# Patient Record
Sex: Male | Born: 1995 | Race: Black or African American | Hispanic: No | Marital: Married | Smoking: Current some day smoker
Health system: Southern US, Community
[De-identification: ages and names within clinical notes are randomized; demographics above are authoritative.]

## PROBLEM LIST (undated history)

## (undated) HISTORY — PX: ANKLE SURGERY: SHX546

---
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
## 2019-11-24 ENCOUNTER — Encounter (HOSPITAL_COMMUNITY): Payer: Self-pay

## 2019-11-24 ENCOUNTER — Other Ambulatory Visit: Payer: Self-pay

## 2019-11-24 ENCOUNTER — Emergency Department (HOSPITAL_COMMUNITY)
Admission: EM | Admit: 2019-11-24 | Discharge: 2019-11-24 | Disposition: A | Discharge status: Home / Self Care | Attending: Emergency Medicine | Admitting: Emergency Medicine

## 2019-11-24 DIAGNOSIS — T63441A Toxic effect of venom of bees, accidental (unintentional), initial encounter: Secondary | ICD-10-CM | POA: Diagnosis present

## 2019-11-24 DIAGNOSIS — F17200 Nicotine dependence, unspecified, uncomplicated: Secondary | ICD-10-CM | POA: Insufficient documentation

## 2019-11-24 MED ORDER — DIPHENHYDRAMINE HCL 25 MG PO CAPS
25.0000 mg | ORAL_CAPSULE | Freq: Once | ORAL | Status: AC
  Administered 2019-11-24: 25 mg via ORAL
  Filled 2019-11-24: qty 1

## 2019-11-24 MED ORDER — EPINEPHRINE 0.3 MG/0.3ML IJ SOAJ: 0.3000 mg | INTRAMUSCULAR | 0 refills | Status: AC | PRN

## 2019-11-24 MED ORDER — IBUPROFEN 200 MG PO TABS
600.0000 mg | ORAL_TABLET | Freq: Once | ORAL | Status: AC
  Administered 2019-11-24: 600 mg via ORAL
  Filled 2019-11-24: qty 3

## 2019-11-24 NOTE — ED Provider Notes (Signed)
[facility]-EMERGENCY DEPT Provider Note   CSN: [id number] Arrival date & time: 11/24/19  1434     History Chief Complaint  Patient presents with  . Insect Bite    Sean Andersen is a 24 y.o. male.  HPI   Patient presents to the emergency department  with chief complaint of bee stings that happened today while he was mowing the lawn.  Patient explains he was stung by several bees on his arms and legs.  He denies history of anaphylactic shock to bees, denies swelling of the tongue or throat, difficulty breathing, diffuse rash.  He does admit that the bites are a burning sensation and denies itchiness.  He has not taken any medication for this, he denies alleviating or aggravating factors.  He has no significant medical history, does not take any medication on daily basis.  He denies headache, fever, chills, shortness breath, chest pain, abdominal pain, dysuria, pedal edema.  History reviewed. No pertinent past medical history.  There are no problems to display for this patient.   Past Surgical History:  Procedure Laterality Date  . ANKLE SURGERY         History reviewed. No pertinent family history.  Social History   Tobacco Use  . Smoking status: Current Some Day Smoker  . Smokeless tobacco: Never Used  Vaping Use  . Vaping Use: Never used  Substance Use Topics  . Alcohol use: Not Currently  . Drug use: Never    Home Medications Prior to Admission medications   Medication Sig Start Date End Date Taking? Authorizing Provider  EPINEPHrine 0.3 mg/0.3 mL IJ SOAJ injection Inject 0.3 mLs (0.3 mg total) into the muscle as needed for up to 2 doses for anaphylaxis. 11/24/19   Terald Sleeper, MD    Allergies    Patient has no known allergies.  Review of Systems   Review of Systems  Constitutional: Negative for chills and fever.  HENT: Negative for congestion, sore throat, tinnitus and trouble swallowing.   Eyes: Negative for visual  disturbance.  Respiratory: Negative for cough, chest tightness and shortness of breath.   Cardiovascular: Negative for chest pain, palpitations and leg swelling.  Gastrointestinal: Negative for abdominal pain, diarrhea, nausea and vomiting.  Genitourinary: Negative for enuresis, scrotal swelling and testicular pain.  Musculoskeletal: Negative for back pain and joint swelling.       Admits to burning sensation on his arms and legs after being stung by multiple bees.  Skin: Positive for rash.       Patient admits to multiple bee stings that staying.  Neurological: Negative for dizziness, light-headedness and headaches.  Hematological: Does not bruise/bleed easily.    Physical Exam Updated Vital Signs BP (!) 167/84 (BP Location: Left Arm)   Pulse (!) 102   Temp 98 F (36.7 C) (Oral)   Resp 16   Ht 5\' 11"  (1.803 m)   Wt 104.3 kg   SpO2 98%   BMI 32.08 kg/m   Physical Exam Vitals and nursing note reviewed.  Constitutional:      General: He is not in acute distress.    Appearance: He is not ill-appearing.  HENT:     Head: Normocephalic and atraumatic.     Nose: No congestion.     Mouth/Throat:     Mouth: Mucous membranes are moist.     Pharynx: Oropharynx is clear. No oropharyngeal exudate or posterior oropharyngeal erythema.     Comments: Oropharynx visualized, tongue and uvula are both midline  no swelling was noted, patient is controlling his own secretions difficulty. Eyes:     General: No scleral icterus. Cardiovascular:     Rate and Rhythm: Normal rate and regular rhythm.     Pulses: Normal pulses.     Heart sounds: No murmur heard.  No friction rub. No gallop.   Pulmonary:     Effort: No respiratory distress.     Breath sounds: No stridor. No wheezing, rhonchi or rales.  Chest:     Chest wall: No tenderness.  Abdominal:     General: There is no distension.     Tenderness: There is no abdominal tenderness. There is no right CVA tenderness, left CVA tenderness or  guarding.  Musculoskeletal:        General: No swelling.  Skin:    General: Skin is warm and dry.     Capillary Refill: Capillary refill takes less than 2 seconds.     Findings: No rash.     Comments: Patient had multiple papules on his arms and legs from the bee stings.  They were not erythematous, no drainage or discharge noted on exam.  Neurological:     Mental Status: He is alert and oriented to person, place, and time.  Psychiatric:        Mood and Affect: Mood normal.     ED Results / Procedures / Treatments   Labs (all labs ordered are listed, but only abnormal results are displayed) Labs Reviewed - No data to display  EKG None  Radiology No results found.  Procedures Procedures (including critical care time)  Medications Ordered in ED Medications  diphenhydrAMINE (BENADRYL) capsule 25 mg (25 mg Oral Given 11/24/19 1554)  ibuprofen (ADVIL) tablet 600 mg (600 mg Oral Given 11/24/19 1555)    ED Course  I have reviewed the triage vital signs and the nursing notes.  Pertinent labs & imaging results that were available during my care of the patient were reviewed by me and considered in my medical decision making (see chart for details).  Clinical Course as of Nov 24 1738  Fri Nov 24, 2019  1549 24 yo male presenting to ED with bee stings to his arms and his ankle at home.  He was mowing his mother's lawn this afternoon and thinks he stumbled across a hornet's nest.  He was stung in the arms and the ankle.  On my exam he has no signs or symptoms of anaphyalxis.  No hives, no airway swelling, no GI upset, BP is hypertensive but stable.  Oropharynx non-erythematous.  No tonsillar swelling or exudate.  No uvular deviation.  No drooling. No brawny edema. No stridor. Voice is not muffled   [MT]  1552 Okay to treat with benadryl and motrin.  Would not give steroids as he just finished a full course for another reaction earlier this week, and it is not likely to help.   I  offered an epi pen script and discussed when to use this at home if he ever feels he has difficulty breathing, airway swelling, diffuse hives and lightheadedness or GI upset   [MT]    Clinical Course User Index [MT] Trifan, Kermit Balo, MD   MDM Rules/Calculators/A&P                          I have personally reviewed all imaging, labs and have interpreted them.  On exam patient does not appear to be in acute distress, resting calmly  in bed, vital signs reassuring.  Or exam was performed tongue uvula both midline, no signs of impending airway compromise.  No diffuse rash noted, lung sounds are clear bilaterally no stridor or rhonchi heard.  Will provide patient with Benadryl and Motrin.Will defer steroids at this time as there is no swelling or diffuse rash noted on exam.    Unlikely patient suffering from anaphylactic shock as he has no history anaphylaxis from bee stings, vital signs reassuring, no signs of impending compromised airway, lung sounds were clear bilaterally.  Unlikely patient suffering from systemic infection as patient is nontoxic-appearing, vital signs reassuring, physical exam benign, no obvious source of infection.  Due to well appearing patient further imaging and lab work were not indicated at this time.  Patient appears resting calmly bed showing no acute signs chest.  Vital signs have remained stable does not meet criteria to be admitted to the hospital.  Likely patient suffered multiple bee stings and I recommend that he takes Benadryl and over-the-counter pain medication.  Recommend that he follows up with primary care doctor for reevaluation.  Attending agrees with assessment and plan.  Patient is given at home schedule strict return precautions.  Patient verbalized that he understood agrees to plan. Final Clinical Impression(s) / ED Diagnoses Final diagnoses:  Bee sting, accidental or unintentional, initial encounter    Rx / DC Orders ED Discharge Orders          Ordered    EPINEPHrine 0.3 mg/0.3 mL IJ SOAJ injection  As needed     Discontinue  Reprint     11/24/19 1548           Carroll Sage, PA-C 11/24/19 1740    Terald Sleeper, MD 11/25/19 [phone]

## 2019-11-24 NOTE — Discharge Instructions (Signed)
You have been seen here for bee stings.  Your exam and vitals look reassuring.  I recommend that you take Claritin as this will help with swelling as well as itchiness.  Please take this once a day for the next 7 days.  Also recommend that you use hydrocortisone cream please place cream on bites as this can also help with itchiness and swelling.  For pain, you may take over-the-counter pain medications like ibuprofen and orTylenol every 6 hours, follow dosing on the back of bottle.  I want you to follow-up with your primary care doctor in 2 weeks time if symptoms are not fully resolved.  I provided you with the contact information for [facility] they work with individuals with little to no insurance and help you find a primary care provider.  Please contact them at your earliest convenience.  I want you to come back to emergency department if you develop swelling of your tongue or throat, difficulty swallowing own saliva, trouble breathing, developed hives all of your body, chest pain, shortness of breath, uncontrolled nausea, vomiting, abdominal pain as these symptoms require further evaluation management.

## 2019-11-24 NOTE — ED Triage Notes (Signed)
Patient states he ws mowing and got stung by 7 bees. Patient denies any problems swallowing or breathing. Patient is  staating to have swelling in bilateral arms and hands, right ankle.

## 2020-05-28 ENCOUNTER — Other Ambulatory Visit: Payer: Self-pay | Admitting: Orthopaedic Surgery

## 2020-05-28 DIAGNOSIS — M25572 Pain in left ankle and joints of left foot: Secondary | ICD-10-CM

## 2020-06-12 ENCOUNTER — Ambulatory Visit
Admission: RE | Admit: 2020-06-12 | Discharge: 2020-06-12 | Disposition: A | Discharge status: Home / Self Care | Source: Ambulatory Visit | Attending: Orthopaedic Surgery | Admitting: Orthopaedic Surgery

## 2020-06-12 DIAGNOSIS — M25572 Pain in left ankle and joints of left foot: Secondary | ICD-10-CM

## 2020-10-15 DIAGNOSIS — M19072 Primary osteoarthritis, left ankle and foot: Secondary | ICD-10-CM | POA: Diagnosis not present

## 2020-10-15 DIAGNOSIS — M93272 Osteochondritis dissecans, left ankle and joints of left foot: Secondary | ICD-10-CM | POA: Diagnosis not present

## 2020-10-15 DIAGNOSIS — M25475 Effusion, left foot: Secondary | ICD-10-CM | POA: Diagnosis not present

## 2021-01-27 DIAGNOSIS — L290 Pruritus ani: Secondary | ICD-10-CM | POA: Diagnosis not present

## 2021-01-27 DIAGNOSIS — R0789 Other chest pain: Secondary | ICD-10-CM | POA: Diagnosis not present

## 2021-01-30 DIAGNOSIS — R059 Cough, unspecified: Secondary | ICD-10-CM | POA: Diagnosis not present

## 2021-02-18 DIAGNOSIS — F329 Major depressive disorder, single episode, unspecified: Secondary | ICD-10-CM | POA: Diagnosis not present

## 2021-02-18 DIAGNOSIS — Z0001 Encounter for general adult medical examination with abnormal findings: Secondary | ICD-10-CM | POA: Diagnosis not present

## 2021-02-18 DIAGNOSIS — R0789 Other chest pain: Secondary | ICD-10-CM | POA: Diagnosis not present

## 2021-02-19 DIAGNOSIS — Z131 Encounter for screening for diabetes mellitus: Secondary | ICD-10-CM | POA: Diagnosis not present

## 2021-02-19 DIAGNOSIS — Z13220 Encounter for screening for lipoid disorders: Secondary | ICD-10-CM | POA: Diagnosis not present

## 2021-03-03 ENCOUNTER — Ambulatory Visit
Admission: RE | Admit: 2021-03-03 | Discharge: 2021-03-03 | Disposition: A | Discharge status: Home / Self Care | Payer: BC Managed Care – PPO | Source: Ambulatory Visit | Attending: Sports Medicine | Admitting: Sports Medicine

## 2021-03-03 ENCOUNTER — Other Ambulatory Visit: Payer: Self-pay | Admitting: Sports Medicine

## 2021-03-03 DIAGNOSIS — M25572 Pain in left ankle and joints of left foot: Secondary | ICD-10-CM

## 2021-03-07 ENCOUNTER — Other Ambulatory Visit: Payer: Self-pay | Admitting: Sports Medicine

## 2021-03-07 DIAGNOSIS — F329 Major depressive disorder, single episode, unspecified: Secondary | ICD-10-CM | POA: Diagnosis not present

## 2021-03-07 DIAGNOSIS — M25572 Pain in left ankle and joints of left foot: Secondary | ICD-10-CM

## 2021-03-30 ENCOUNTER — Other Ambulatory Visit: Payer: Self-pay

## 2021-03-30 ENCOUNTER — Ambulatory Visit
Admission: RE | Admit: 2021-03-30 | Discharge: 2021-03-30 | Disposition: A | Discharge status: Home / Self Care | Payer: BC Managed Care – PPO | Source: Ambulatory Visit | Attending: Sports Medicine | Admitting: Sports Medicine

## 2021-03-30 DIAGNOSIS — R600 Localized edema: Secondary | ICD-10-CM | POA: Diagnosis not present

## 2021-03-30 DIAGNOSIS — M25572 Pain in left ankle and joints of left foot: Secondary | ICD-10-CM

## 2021-03-30 DIAGNOSIS — M19072 Primary osteoarthritis, left ankle and foot: Secondary | ICD-10-CM | POA: Diagnosis not present

## 2021-03-30 DIAGNOSIS — Q666 Other congenital valgus deformities of feet: Secondary | ICD-10-CM | POA: Diagnosis not present

## 2021-03-30 DIAGNOSIS — M25472 Effusion, left ankle: Secondary | ICD-10-CM | POA: Diagnosis not present

## 2021-05-19 DIAGNOSIS — F329 Major depressive disorder, single episode, unspecified: Secondary | ICD-10-CM | POA: Diagnosis not present

## 2021-07-01 DIAGNOSIS — R590 Localized enlarged lymph nodes: Secondary | ICD-10-CM | POA: Diagnosis not present

## 2021-07-01 DIAGNOSIS — Z01812 Encounter for preprocedural laboratory examination: Secondary | ICD-10-CM | POA: Diagnosis not present

## 2021-07-01 DIAGNOSIS — R109 Unspecified abdominal pain: Secondary | ICD-10-CM | POA: Diagnosis not present

## 2021-07-01 DIAGNOSIS — K380 Hyperplasia of appendix: Secondary | ICD-10-CM | POA: Diagnosis not present

## 2021-07-02 DIAGNOSIS — K37 Unspecified appendicitis: Secondary | ICD-10-CM | POA: Diagnosis not present

## 2021-07-30 ENCOUNTER — Emergency Department (HOSPITAL_COMMUNITY): Payer: No Typology Code available for payment source

## 2021-07-30 ENCOUNTER — Encounter (HOSPITAL_COMMUNITY): Payer: Self-pay

## 2021-07-30 ENCOUNTER — Emergency Department (HOSPITAL_COMMUNITY)
Admission: EM | Admit: 2021-07-30 | Discharge: 2021-07-31 | Disposition: A | Discharge status: Home / Self Care | Payer: No Typology Code available for payment source | Attending: Emergency Medicine | Admitting: Emergency Medicine

## 2021-07-30 ENCOUNTER — Other Ambulatory Visit: Payer: Self-pay

## 2021-07-30 DIAGNOSIS — M79605 Pain in left leg: Secondary | ICD-10-CM | POA: Diagnosis not present

## 2021-07-30 DIAGNOSIS — G8929 Other chronic pain: Secondary | ICD-10-CM | POA: Insufficient documentation

## 2021-07-30 NOTE — ED Provider Triage Note (Signed)
Emergency Medicine Provider Triage Evaluation Note ? ?Rudi Coco , a 26 y.o. male  was evaluated in triage.  Pt complains of leg pain. ? ?Review of Systems  ?Positive: Pain L thigh ?Negative: Fever, hip pain, knee pain ? ?Physical Exam  ?BP 133/77 (BP Location: Left Arm)   Pulse 69   Temp 98.2 ?F (36.8 ?C) (Oral)   Resp 16   Ht 5\' 10"  (1.778 m)   Wt 92.1 kg   SpO2 99%   BMI 29.13 kg/m?  ?Gen:   Awake, no distress   ?Resp:  Normal effort  ?MSK:   Moves extremities without difficulty  ?Other:   ? ?Medical Decision Making  ?Medically screening exam initiated at 9:24 PM.  Appropriate orders placed.  Caid Henline was informed that the remainder of the evaluation will be completed by another provider, this initial triage assessment does not replace that evaluation, and the importance of remaining in the ED until their evaluation is complete. ? ?Pain to L thigh x 1 month, worse today.  Injured L ankle in the past. No hx of DVT or risk factors for DVT ?  ?Domenic Moras, PA-C ?07/30/21 2128 ? ?

## 2021-07-30 NOTE — ED Triage Notes (Signed)
Pt complaining of left leg pain that started a month ago. Hurting more in the thigh. Hx of ankle injury on that same side.  ?

## 2021-07-31 MED ORDER — HYDROCODONE-ACETAMINOPHEN 5-325 MG PO TABS: 1.0000 | ORAL_TABLET | ORAL | 0 refills | Status: AC | PRN

## 2021-07-31 NOTE — Discharge Instructions (Signed)
You were evaluated in the Emergency Department and after careful evaluation, we did not find any emergent condition requiring admission or further testing in the hospital. ? ?Your exam/testing today was overall reassuring.  Symptoms may be due to quadriceps tendinitis.  Recommend continued use of anti-inflammatories.  Recommend follow-up with the orthopedic specialist.  X-ray did not show any abnormalities. ? ?Please return to the Emergency Department if you experience any worsening of your condition.  Thank you for allowing [place] to be a part of your care. ? ?

## 2021-07-31 NOTE — ED Provider Notes (Signed)
?[facility]-EMERGENCY DEPT ?[facility] Emergency Department ?Provider Note ?MRN:  [id number]  ?Arrival date & time: 07/31/21    ? ?Chief Complaint   ?Leg Pain ?  ?History of Present Illness   ?Cesare Sumlin is a 26 y.o. year-old male with no pertinent past medical history presenting to the ED with chief complaint of leg pain. ? ?Pain to the left thigh for the past month, not getting better.  Worse with ambulating or using the leg.  Patient also having ankle issues, had surgery on his ankle a few years ago and needs another surgery.  Denies fever, no rash, no swelling, no chest pain or shortness of breath, no other complaints. ? ?Review of Systems  ?A thorough review of systems was obtained and all systems are negative except as noted in the HPI and PMH.  ? ?Patient's Health History   ?History reviewed. No pertinent past medical history.  ?Past Surgical History:  ?Procedure Laterality Date  ? ANKLE SURGERY    ?  ?History reviewed. No pertinent family history.  ?Social History  ? ?Socioeconomic History  ? Marital status: Married  ?  Spouse name: Not on file  ? Number of children: Not on file  ? Years of education: Not on file  ? Highest education level: Not on file  ?Occupational History  ? Not on file  ?Tobacco Use  ? Smoking status: Some Days  ? Smokeless tobacco: Never  ?Vaping Use  ? Vaping Use: Never used  ?Substance and Sexual Activity  ? Alcohol use: Not Currently  ? Drug use: Never  ? Sexual activity: Not on file  ?Other Topics Concern  ? Not on file  ?Social History Narrative  ? Not on file  ? ?Social Determinants of Health  ? ?Financial Resource Strain: Not on file  ?Food Insecurity: Not on file  ?Transportation Needs: Not on file  ?Physical Activity: Not on file  ?Stress: Not on file  ?Social Connections: Not on file  ?Intimate Partner Violence: Not on file  ?  ? ?Physical Exam  ? ?Vitals:  ? 07/30/21 2027 07/30/21 2116  ?BP: 133/77   ?Pulse: 69   ?Resp: 16   ?Temp: 98.2 ?F (36.8 ?C)   ?SpO2: 99% 99%   ?  ?CONSTITUTIONAL: Well-appearing, NAD ?NEURO/PSYCH:  Alert and oriented x 3, no focal deficits ?EYES:  eyes equal and reactive ?ENT/NECK:  no LAD, no JVD ?CARDIO: Regular rate, well-perfused, normal S1 and S2 ?PULM:  CTAB no wheezing or rhonchi ?GI/GU:  non-distended, non-tender ?MSK/SPINE:  No gross deformities, no edema ?SKIN:  no rash, atraumatic ? ? ?*Additional and/or pertinent findings included in MDM below ? ?Diagnostic and Interventional Summary  ? ? EKG Interpretation ? ?Date/Time:    ?Ventricular Rate:    ?PR Interval:    ?QRS Duration:   ?QT Interval:    ?QTC Calculation:   ?R Axis:     ?Text Interpretation:   ?  ? ?  ? ?Labs Reviewed - No data to display  ?DG Femur Min 2 Views Left  ?Final Result  ?  ?  ?Medications - No data to display  ? ?Procedures  /  Critical Care ?Procedures ? ?ED Course and Medical Decision Making  ?Initial Impression and Ddx ?This is a an otherwise pretty healthy 26 year old male with normal vital signs presenting with chronic left thigh pain.  The leg looks completely normal, he has normal range of motion, range of motion does not elicit any pain, no erythema, no increased warmth, no  swelling, nothing to suggest infection or DVT.  Neurovascularly completely intact.  Nothing to suggest an emergent process at this time.  Suspect quadriceps tendinitis possibly triggered by having chronic issues with his ankle on the same leg, appropriate for discharge with orthopedic follow-up. ? ?Past medical/surgical history that increases complexity of ED encounter: History of ankle surgery ? ?Interpretation of Diagnostics ?I personally reviewed the femur x-ray and my interpretation is as follows: Normal, no obvious fracture or mass ?   ? ? ?Patient Reassessment and Ultimate Disposition/Management ?Discharge home ? ?Patient management required discussion with the following services or consulting groups:  None ? ?Complexity of Problems Addressed ?Acute complicated illness or  Injury ? ?Additional Data Reviewed and Analyzed ?Further history obtained from: ?Further history from spouse/family member ? ?Additional Factors Impacting ED Encounter Risk ?Prescriptions ? ?Elmer Sow. Pilar Plate, MD ?[facility] Emergency Medicine ?[facility] [email or web address] ? ?Final Clinical Impressions(s) / ED Diagnoses  ? ?  ICD-10-CM   ?1. Pain of left lower extremity  M79.605   ?  ?  ?ED Discharge Orders   ? ?      Ordered  ?  HYDROcodone-acetaminophen (NORCO/VICODIN) 5-325 MG tablet  Every 4 hours PRN       ? 07/31/21 0037  ? ?  ?  ? ?  ?  ? ?Discharge Instructions Discussed with and Provided to Patient:  ? ? ?Discharge Instructions   ? ?  ?You were evaluated in the Emergency Department and after careful evaluation, we did not find any emergent condition requiring admission or further testing in the hospital. ? ?Your exam/testing today was overall reassuring.  Symptoms may be due to quadriceps tendinitis.  Recommend continued use of anti-inflammatories.  Recommend follow-up with the orthopedic specialist.  X-ray did not show any abnormalities. ? ?Please return to the Emergency Department if you experience any worsening of your condition.  Thank you for allowing [place] to be a part of your care. ? ? ? ? ?  ?Sabas Sous, MD ?07/31/21 [phone] ? ?

## 2021-09-09 ENCOUNTER — Other Ambulatory Visit: Payer: Self-pay | Admitting: Orthopaedic Surgery

## 2021-09-09 DIAGNOSIS — M216X2 Other acquired deformities of left foot: Secondary | ICD-10-CM

## 2021-09-09 DIAGNOSIS — M216X9 Other acquired deformities of unspecified foot: Secondary | ICD-10-CM

## 2021-09-11 ENCOUNTER — Other Ambulatory Visit: Payer: Self-pay | Admitting: Orthopaedic Surgery

## 2021-09-11 DIAGNOSIS — M216X9 Other acquired deformities of unspecified foot: Secondary | ICD-10-CM

## 2021-09-16 ENCOUNTER — Other Ambulatory Visit: Payer: No Typology Code available for payment source

## 2021-09-17 ENCOUNTER — Ambulatory Visit
Admission: RE | Admit: 2021-09-17 | Discharge: 2021-09-17 | Disposition: A | Discharge status: Home / Self Care | Payer: BC Managed Care – PPO | Source: Ambulatory Visit | Attending: Orthopaedic Surgery | Admitting: Orthopaedic Surgery

## 2021-09-17 DIAGNOSIS — M216X9 Other acquired deformities of unspecified foot: Secondary | ICD-10-CM

## 2021-09-23 ENCOUNTER — Other Ambulatory Visit: Payer: No Typology Code available for payment source

## 2021-09-29 ENCOUNTER — Ambulatory Visit
Admission: RE | Admit: 2021-09-29 | Discharge: 2021-09-29 | Disposition: A | Discharge status: Home / Self Care | Payer: No Typology Code available for payment source | Source: Ambulatory Visit | Attending: Orthopaedic Surgery | Admitting: Orthopaedic Surgery

## 2021-09-29 ENCOUNTER — Other Ambulatory Visit: Payer: No Typology Code available for payment source

## 2021-09-29 DIAGNOSIS — M216X9 Other acquired deformities of unspecified foot: Secondary | ICD-10-CM

## 2021-10-16 ENCOUNTER — Other Ambulatory Visit: Payer: No Typology Code available for payment source

## 2021-12-08 ENCOUNTER — Emergency Department (HOSPITAL_COMMUNITY): Payer: No Typology Code available for payment source

## 2021-12-08 ENCOUNTER — Other Ambulatory Visit: Payer: Self-pay

## 2021-12-08 ENCOUNTER — Encounter (HOSPITAL_COMMUNITY): Payer: Self-pay

## 2021-12-08 ENCOUNTER — Emergency Department (HOSPITAL_COMMUNITY)
Admission: EM | Admit: 2021-12-08 | Discharge: 2021-12-08 | Disposition: A | Discharge status: Home / Self Care | Payer: No Typology Code available for payment source | Attending: Emergency Medicine | Admitting: Emergency Medicine

## 2021-12-08 DIAGNOSIS — R55 Syncope and collapse: Secondary | ICD-10-CM | POA: Insufficient documentation

## 2021-12-08 DIAGNOSIS — R531 Weakness: Secondary | ICD-10-CM | POA: Insufficient documentation

## 2021-12-08 DIAGNOSIS — R42 Dizziness and giddiness: Secondary | ICD-10-CM | POA: Diagnosis not present

## 2021-12-08 DIAGNOSIS — R109 Unspecified abdominal pain: Secondary | ICD-10-CM | POA: Insufficient documentation

## 2021-12-08 DIAGNOSIS — R5383 Other fatigue: Secondary | ICD-10-CM | POA: Insufficient documentation

## 2021-12-08 LAB — URINALYSIS, ROUTINE W REFLEX MICROSCOPIC
Bilirubin Urine: NEGATIVE
Glucose, UA: NEGATIVE mg/dL
Hgb urine dipstick: NEGATIVE
Ketones, ur: NEGATIVE mg/dL
Leukocytes,Ua: NEGATIVE
Nitrite: NEGATIVE
Protein, ur: NEGATIVE mg/dL
Specific Gravity, Urine: 1.021 (ref 1.005–1.030)
pH: 6 (ref 5.0–8.0)

## 2021-12-08 LAB — HEPATIC FUNCTION PANEL
ALT: 25 U/L (ref 0–44)
AST: 22 U/L (ref 15–41)
Albumin: 3.9 g/dL (ref 3.5–5.0)
Alkaline Phosphatase: 38 U/L (ref 38–126)
Bilirubin, Direct: 0.2 mg/dL (ref 0.0–0.2)
Indirect Bilirubin: 0.4 mg/dL (ref 0.3–0.9)
Total Bilirubin: 0.6 mg/dL (ref 0.3–1.2)
Total Protein: 6.7 g/dL (ref 6.5–8.1)

## 2021-12-08 LAB — BASIC METABOLIC PANEL
Anion gap: 5 (ref 5–15)
BUN: 16 mg/dL (ref 6–20)
CO2: 24 mmol/L (ref 22–32)
Calcium: 9.3 mg/dL (ref 8.9–10.3)
Chloride: 111 mmol/L (ref 98–111)
Creatinine, Ser: 1.05 mg/dL (ref 0.61–1.24)
GFR, Estimated: >60 mL/min (ref >60)
Glucose, Bld: 117 mg/dL — ABNORMAL HIGH (ref 70–99)
Potassium: 4.1 mmol/L (ref 3.5–5.1)
Sodium: 140 mmol/L (ref 135–145)

## 2021-12-08 LAB — CBC
HCT: 40.1 % (ref 39.0–52.0)
Hemoglobin: 13.4 g/dL (ref 13.0–17.0)
MCH: 32.8 pg (ref 26.0–34.0)
MCHC: 33.4 g/dL (ref 30.0–36.0)
MCV: 98 fL (ref 80.0–100.0)
Platelets: 239 10*3/uL (ref 150–400)
RBC: 4.09 MIL/uL — ABNORMAL LOW (ref 4.22–5.81)
RDW: 13.3 % (ref 11.5–15.5)
WBC: 8.6 10*3/uL (ref 4.0–10.5)
nRBC: 0 % (ref 0.0–0.2)

## 2021-12-08 LAB — CBG MONITORING, ED
Glucose-Capillary: 118 mg/dL — ABNORMAL HIGH (ref 70–99)
Glucose-Capillary: 126 mg/dL — ABNORMAL HIGH (ref 70–99)

## 2021-12-08 LAB — TROPONIN I (HIGH SENSITIVITY): Troponin I (High Sensitivity): 3 ng/L (ref <18)

## 2021-12-08 LAB — D-DIMER, QUANTITATIVE: D-Dimer, Quant: 0.29 ug/mL-FEU (ref 0.00–0.50)

## 2021-12-08 LAB — LIPASE, BLOOD: Lipase: 29 U/L (ref 11–51)

## 2021-12-08 MED ORDER — SODIUM CHLORIDE 0.9 % IV BOLUS
1000.0000 mL | Freq: Once | INTRAVENOUS | Status: AC
  Administered 2021-12-08: 1000 mL via INTRAVENOUS

## 2021-12-08 MED ORDER — CYCLOBENZAPRINE HCL 10 MG PO TABS: 10.0000 mg | ORAL_TABLET | Freq: Two times a day (BID) | ORAL | 0 refills | Status: AC | PRN

## 2021-12-08 NOTE — ED Triage Notes (Signed)
Pt reports back pain that began today and an episode of abdominal pain earlier today. Pt reports feeling lightheaded and dizzy when walking.

## 2021-12-08 NOTE — ED Provider Notes (Signed)
Sean Andersen   CSN: [id number] Arrival date & time: 12/08/21  1850     History  Chief Complaint  Patient presents with   Dizziness   Back Pain    Sean Andersen is a 26 y.o. male.  Patient here with right flank pain, dizziness, weakness, fatigue feeling.  He states he started to have some right flank pain the last 2 days.  Took a muscle relaxant this morning and has been kind of weak and out of it since.  He was getting a haircut when he got hot and flushed and dizzy and almost passed out but did not.  He denies any headache or vision loss or stroke symptoms.  He still having some right-sided flank pain.  Denies history of kidney stone.  Denies any medical problems.  Denies any alcohol or drugs.  Nothing makes it worse or better.  The history is provided by the patient.       Home Medications Prior to Admission medications   Medication Sig Start Date End Date Taking? Authorizing Provider  cyclobenzaprine (FLEXERIL) 10 MG tablet Take 1 tablet (10 mg total) by mouth 2 (two) times daily as needed for muscle spasms. 12/08/21  Yes Jaziyah Gradel, DO  EPINEPHrine 0.3 mg/0.3 mL IJ SOAJ injection Inject 0.3 mLs (0.3 mg total) into the muscle as needed for up to 2 doses for anaphylaxis. 11/24/19   Wyvonnia Dusky, MD  HYDROcodone-acetaminophen (NORCO/VICODIN) 5-325 MG tablet Take 1 tablet by mouth every 4 (four) hours as needed. 07/31/21   Maudie Flakes, MD      Allergies    Penicillin g    Review of Systems   Review of Systems  Physical Exam Updated Vital Signs BP 111/62   Pulse 70   Temp 99.2 F (37.3 C) (Oral)   Resp (!) 23   Ht 5\' 10"  (1.778 m)   Wt 92.1 kg   SpO2 95%   BMI 29.13 kg/m  Physical Exam Vitals and nursing Andersen reviewed.  Constitutional:      General: He is not in acute distress.    Appearance: He is well-developed. He is not ill-appearing.  HENT:     Head: Normocephalic and atraumatic.     Nose: Nose  normal.     Mouth/Throat:     Mouth: Mucous membranes are moist.  Eyes:     Extraocular Movements: Extraocular movements intact.     Conjunctiva/sclera: Conjunctivae normal.     Pupils: Pupils are equal, round, and reactive to light.  Cardiovascular:     Rate and Rhythm: Normal rate and regular rhythm.     Pulses: Normal pulses.     Heart sounds: Normal heart sounds. No murmur heard. Pulmonary:     Effort: Pulmonary effort is normal. No respiratory distress.     Breath sounds: Normal breath sounds.  Abdominal:     Palpations: Abdomen is soft.     Tenderness: There is no abdominal tenderness. There is right CVA tenderness.  Musculoskeletal:        General: No swelling.     Cervical back: Normal range of motion and neck supple.  Skin:    General: Skin is warm and dry.     Capillary Refill: Capillary refill takes less than 2 seconds.  Neurological:     General: No focal deficit present.     Mental Status: He is alert and oriented to person, place, and time.     Cranial Nerves: No cranial  nerve deficit.     Sensory: No sensory deficit.     Motor: No weakness.     Coordination: Coordination normal.     Comments: 5+ out of 5 strength throughout, normal sensation, no drift, normal speech  Psychiatric:        Mood and Affect: Mood normal.     ED Results / Procedures / Treatments   Labs (all labs ordered are listed, but only abnormal results are displayed) Labs Reviewed  BASIC METABOLIC PANEL - Abnormal; Notable for the following components:      Result Value   Glucose, Bld 117 (*)    All other components within normal limits  CBC - Abnormal; Notable for the following components:   RBC 4.09 (*)    All other components within normal limits  CBG MONITORING, ED - Abnormal; Notable for the following components:   Glucose-Capillary 118 (*)    All other components within normal limits  CBG MONITORING, ED - Abnormal; Notable for the following components:   Glucose-Capillary 126 (*)     All other components within normal limits  URINALYSIS, ROUTINE W REFLEX MICROSCOPIC  HEPATIC FUNCTION PANEL  LIPASE, BLOOD  D-DIMER, QUANTITATIVE  TROPONIN I (HIGH SENSITIVITY)    EKG EKG Interpretation  Date/Time:  Monday December 08 2021 19:10:51 EDT Ventricular Rate:  82 PR Interval:  152 QRS Duration: 82 QT Interval:  337 QTC Calculation: 394 R Axis:   92 Text Interpretation: Sinus rhythm early benign repolarization Confirmed by Virgina Norfolk ([id number]) on 12/08/2021 7:31:29 PM  Radiology CT Renal Stone Study  Result Date: 12/08/2021 CLINICAL DATA:  Flank pain EXAM: CT ABDOMEN AND PELVIS WITHOUT CONTRAST TECHNIQUE: Multidetector CT imaging of the abdomen and pelvis was performed following the standard protocol without IV contrast. RADIATION DOSE REDUCTION: This exam was performed according to the departmental dose-optimization program which includes automated exposure control, adjustment of the mA and/or kV according to patient size and/or use of iterative reconstruction technique. COMPARISON:  None Available. FINDINGS: Lower chest: No acute abnormality. Hepatobiliary: No focal hepatic abnormality. Gallbladder unremarkable. Pancreas: No focal abnormality or ductal dilatation. Spleen: No focal abnormality.  Normal size. Adrenals/Urinary Tract: No adrenal abnormality. No focal renal abnormality. No stones or hydronephrosis. Urinary bladder is unremarkable. Stomach/Bowel: Stomach, large and small bowel grossly unremarkable. Vascular/Lymphatic: No evidence of aneurysm or adenopathy. Reproductive: No visible focal abnormality. Other: No free fluid or free air. Musculoskeletal: No acute bony abnormality. IMPRESSION: No renal or ureteral stones.  No hydronephrosis. No acute findings. Electronically Signed   By: Charlett Nose M.D.   On: 12/08/2021 21:34   DG Chest 2 View  Result Date: 12/08/2021 CLINICAL DATA:  Chest pain. EXAM: CHEST - 2 VIEW COMPARISON:  None Available. FINDINGS: The heart size  and mediastinal contours are within normal limits. Both lungs are clear. The visualized skeletal structures are unremarkable. IMPRESSION: No active cardiopulmonary disease. Electronically Signed   By: Elgie Collard M.D.   On: 12/08/2021 19:41    Procedures Procedures    Medications Ordered in ED Medications  sodium chloride 0.9 % bolus 1,000 mL (1,000 mLs Intravenous Bolus 12/08/21 2002)    ED Course/ Medical Decision Making/ A&P                           Medical Decision Making Amount and/or Complexity of Data Reviewed Labs: ordered. Radiology: ordered.   Kyan Yurkovich is here after near syncopal episode, right flank pain.  Normal vitals.  No fever.  Differential diagnosis is kidney stone versus cholecystitis versus less likely ACS or PE or pneumonia or dehydration.  Could be medication side effect as he took Flexeril prior to having lightheadedness event.  Will check CBC, CMP, D-dimer, troponin, EKG, chest x-ray.  We will get CT scan renal study.  Will give IV fluid bolus.  EKG shows benign early repole, no obvious ischemic changes.  Per my review and interpretation of labs is no significant anemia, electrolyte abnormality, kidney injury.  D-dimer is normal and doubt PE.  Troponin is normal.  Doubt ACS.  CT scan of the abdomen pelvis shows no kidney stones or other acute process.  Overall he is feeling better after IV fluids.  Suspect muscle spasm causing his discomfort.  Maybe a little bit more sleepy today as mild side effects from his Flexeril.  Overall given reassurance and discharged in ED in good condition.  Educated about how to carefully use Flexeril.  This chart was dictated using voice recognition software.  Despite best efforts to proofread,  errors can occur which can change the documentation meaning.         Final Clinical Impression(s) / ED Diagnoses Final diagnoses:  Flank pain    Rx / DC Orders ED Discharge Orders          Ordered    cyclobenzaprine  (FLEXERIL) 10 MG tablet  2 times daily PRN        12/08/21 2138              Virgina Norfolk, DO 12/08/21 2138

## 2022-08-03 IMAGING — MR MR ANKLE*L* W/O CM
5 series · 39 of 40 positions shown · non-contrast
Comparison: Multiple exams, including 05/31/2020

CLINICAL DATA: Chronic left ankle pain. Reported remote fracture in
5441.

EXAM:
MRI OF THE LEFT ANKLE WITHOUT CONTRAST
TECHNIQUE: Multiplanar, multisequence MR imaging of the ankle was performed. No
intravenous contrast was administered.

[Series 4: T2 fat-sat · axial · 3.0mm · 0.53mm/px · z∈[-108,+13]mm · 9 of 32 slices shown (1 of 2)]
[im 1/32]
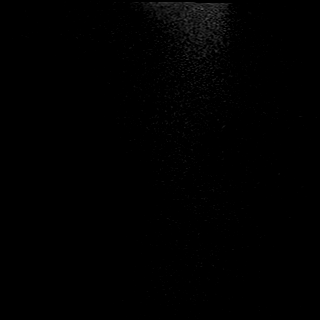
[im 4/32]
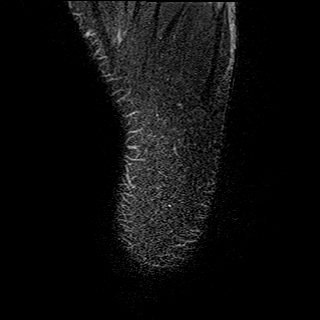
[im 8/32]
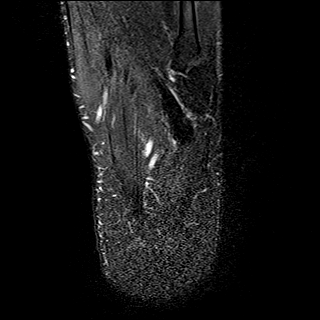
[im 12/32]
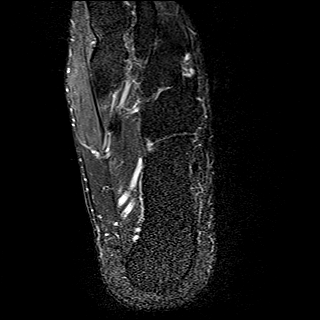
[im 16/32]
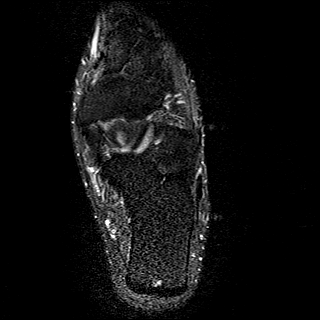
[im 20/32]
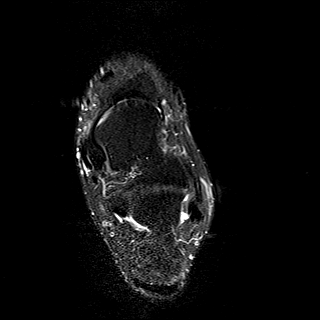
[im 24/32]
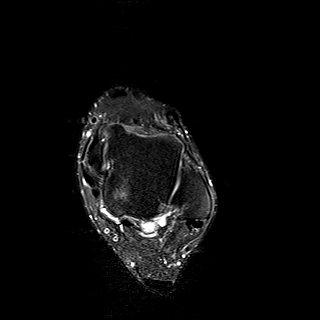
[im 28/32]
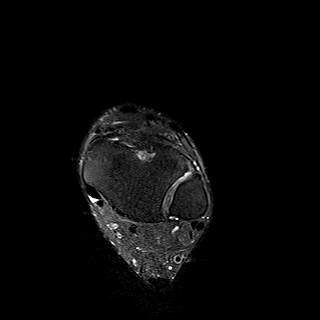
[im 32/32]
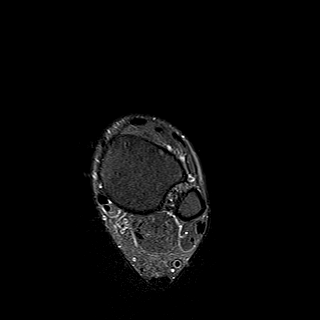

[Series 5: PD fat-sat · axial · 3.0mm · 0.53mm/px · z∈[-108,+13]mm · 9 of 32 slices shown]
[im 1/32]
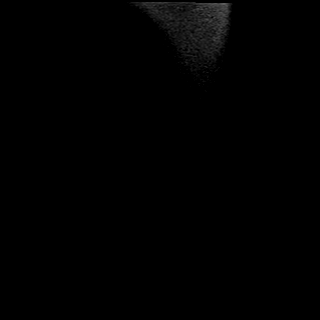
[im 4/32]
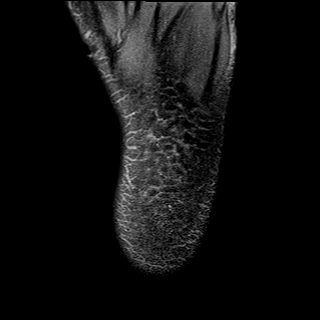
[im 8/32]
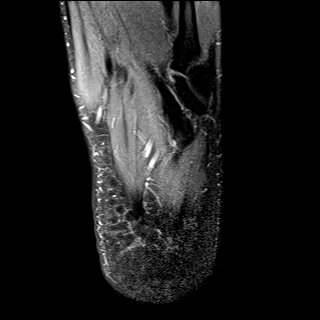
[im 12/32]
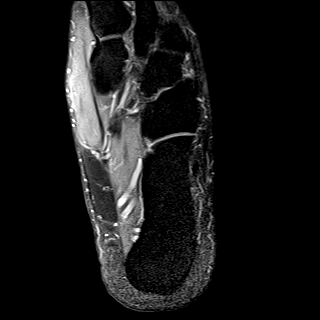
[im 16/32]
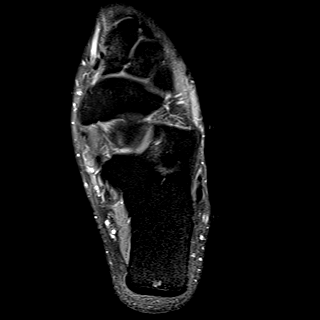
[im 20/32]
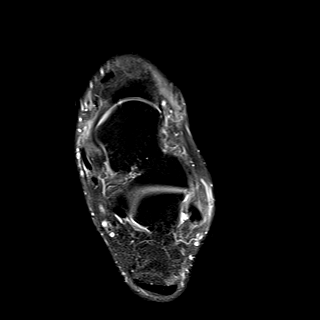
[im 24/32]
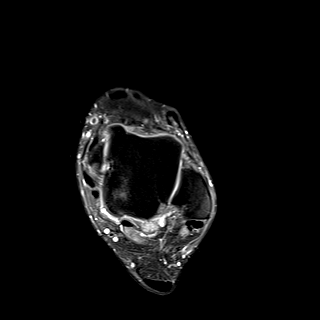
[im 28/32]
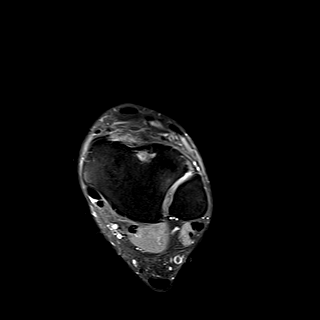
[im 32/32]
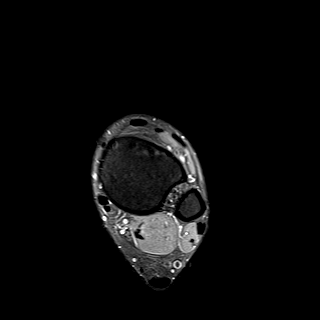

[Series 6: T1 · sagittal · 4.0mm · 0.56mm/px · 5 of 20 slices shown]
[im 1/20]
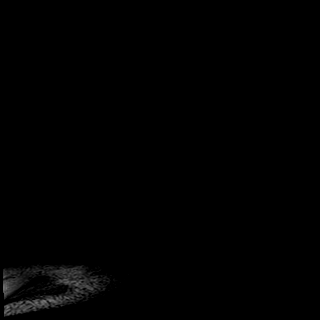
[im 5/20]
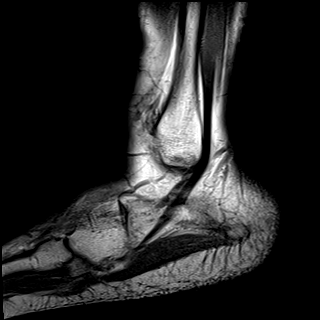
[im 10/20]
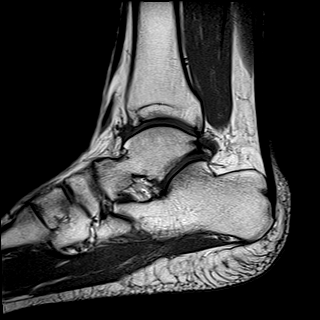
[im 15/20]
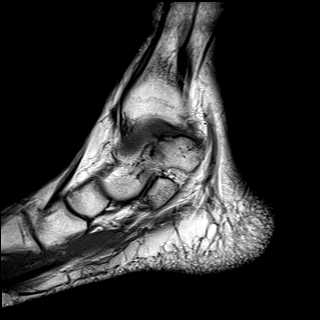
[im 20/20]
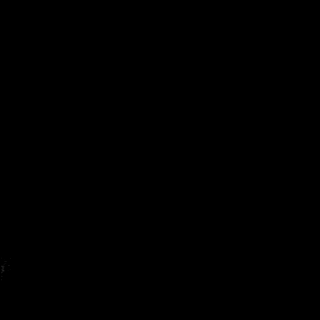

[Series 7: STIR · sagittal · 4.0mm · 0.35mm/px · 5 of 20 slices shown]
[im 1/20]
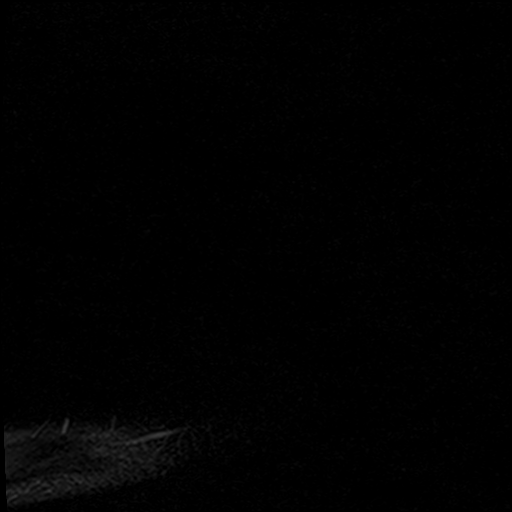
[im 5/20]
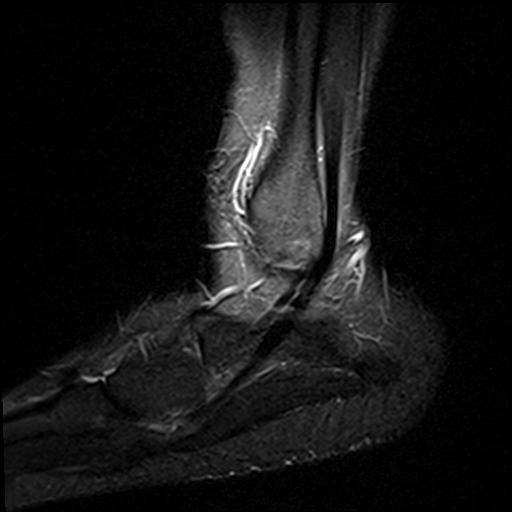
[im 10/20]
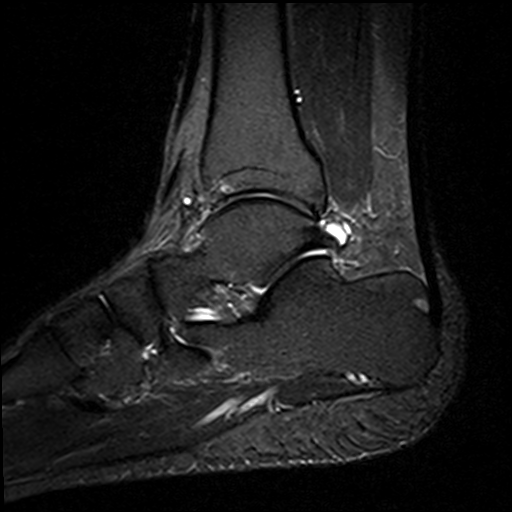
[im 15/20]
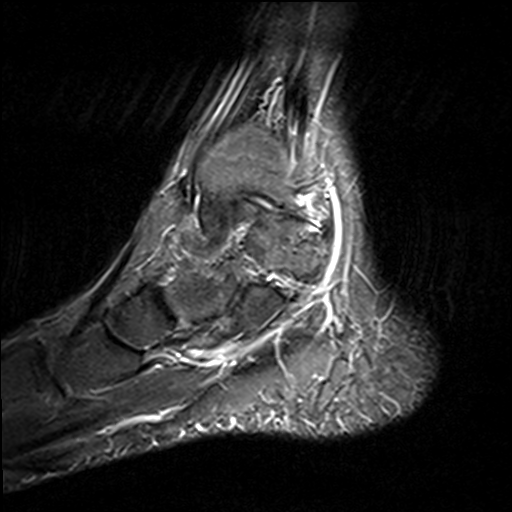
[im 20/20]
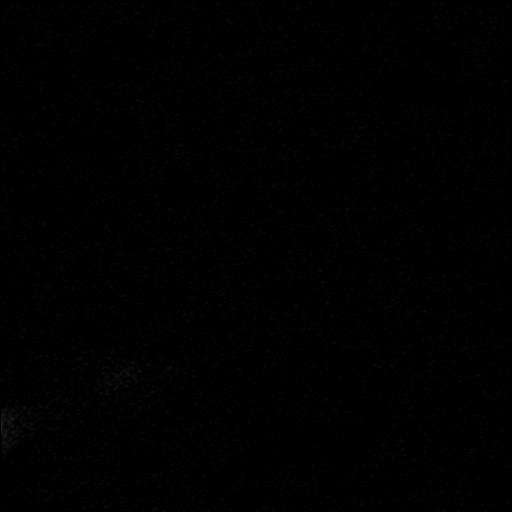

[Series 8: T2 fat-sat · coronal · 3.0mm · 0.50mm/px · 11 of 43 slices shown (2 of 2)]
[im 1/43]
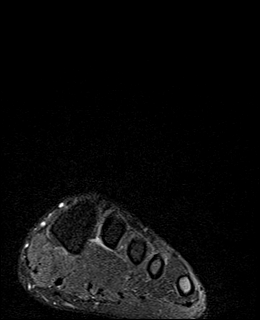
[im 4/43]
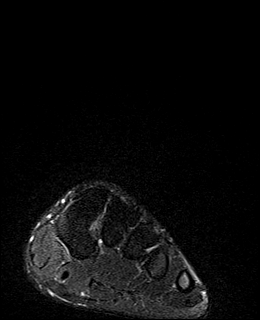
[im 8/43]
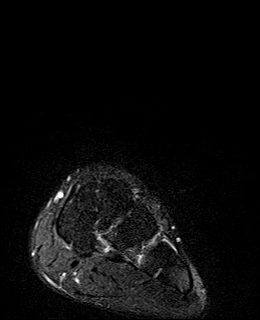
[im 12/43]
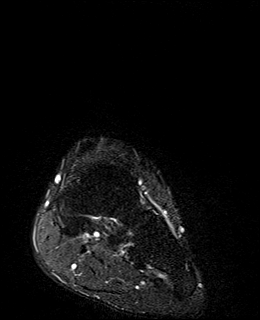
[im 16/43]
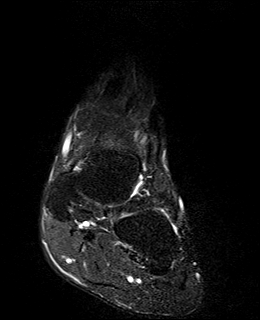
[im 20/43]
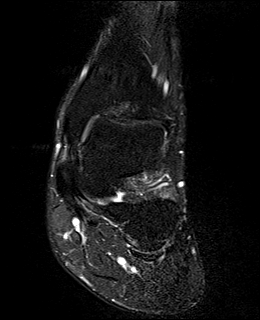
[im 23/43]
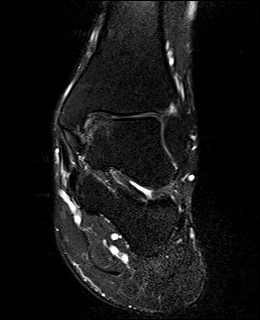
[im 31/43]
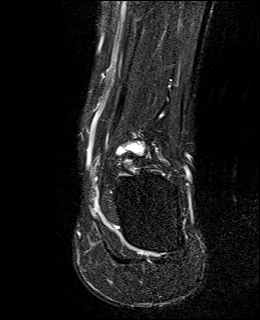
[im 35/43]
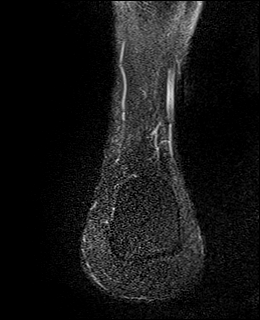
[im 39/43]
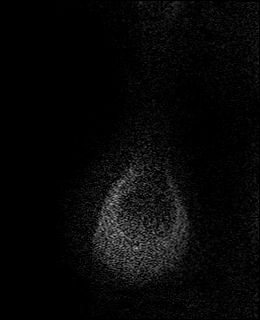
[im 43/43]
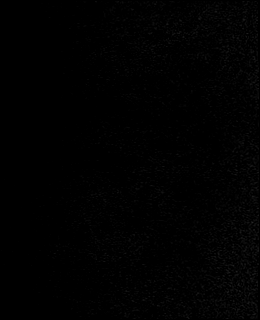

[39 of 40 positions shown; findings below may reference images not displayed]

FINDINGS: TENDONS

Peroneal: Unremarkable

Posteromedial: Mild tibialis posterior tenosynovitis and distal
tendinopathy, correlate clinically in assessing for tibialis
posterior dysfunction.

Anterior: Unremarkable

Achilles: Unremarkable

Plantar Fascia: Unremarkable

LIGAMENTS

Lateral: Mildly thinned ATFL without overt discontinuity.

Medial: Thickened superomedial portion of the spring ligament.

CARTILAGE

Ankle Joint: 1.4 by 1.9 cm osteochondral lesion of the medial talar
dome with associated localized downsloping, mild cortical
irregularity, mild heterogeneity of the overlying cartilage, and
adjacent marrow edema. Overall the cortex appears mildly more
irregular than on 03/30/2021. No loose or unstable fragment.

Small non-fragmented osteochondral lesion of the anterior plafond
measuring about 0.6 by 0.8 cm.

Subtalar Joints/Sinus Tarsi: Unremarkable

Bones: No other significant bony findings.

Other: No supplemental non-categorized findings.
IMPRESSION: 1. The dominant 1.4 by 1.9 cm osteochondral lesion of the medial
talar dome demonstrates mildly increased cortical irregularity and
mildly increased adjacent marrow edema. No unstable fragment or free
fragment identified.
2. Small non-fragmented osteochondral lesion of the anterior
plafond, with amount of surrounding edema reduced from prior.
3. Mild distal tibialis posterior tendinopathy with adjacent
thickened superomedial portion of the spring ligament, correlate
clinically in assessing for tibialis posterior dysfunction.
4. Mildly thinned ATFL without overt discontinuity.
